# Patient Record
Sex: Female | Born: 1985 | Race: White | Hispanic: Yes | Marital: Single | Smoking: Never smoker
Health system: Southern US, Community
[De-identification: ages and names within clinical notes are randomized; demographics above are authoritative.]

## PROBLEM LIST (undated history)

## (undated) DIAGNOSIS — O00109 Unspecified tubal pregnancy without intrauterine pregnancy: Secondary | ICD-10-CM

## (undated) HISTORY — PX: OOPHORECTOMY: SHX86

---

## 2015-05-24 DIAGNOSIS — R040 Epistaxis: Secondary | ICD-10-CM

## 2018-06-03 ENCOUNTER — Emergency Department (HOSPITAL_COMMUNITY): Payer: No Typology Code available for payment source

## 2018-06-03 ENCOUNTER — Emergency Department (HOSPITAL_COMMUNITY)
Admission: EM | Admit: 2018-06-03 | Discharge: 2018-06-03 | Disposition: A | Discharge status: Home / Self Care | Payer: No Typology Code available for payment source | Attending: Emergency Medicine | Admitting: Emergency Medicine

## 2018-06-03 ENCOUNTER — Encounter: Payer: Self-pay | Admitting: Emergency Medicine

## 2018-06-03 DIAGNOSIS — O9989 Other specified diseases and conditions complicating pregnancy, childbirth and the puerperium: Secondary | ICD-10-CM | POA: Diagnosis not present

## 2018-06-03 DIAGNOSIS — Z3A14 14 weeks gestation of pregnancy: Secondary | ICD-10-CM | POA: Diagnosis not present

## 2018-06-03 DIAGNOSIS — R1031 Right lower quadrant pain: Secondary | ICD-10-CM

## 2018-06-03 DIAGNOSIS — R102 Pelvic and perineal pain unspecified side: Secondary | ICD-10-CM

## 2018-06-03 HISTORY — DX: Unspecified tubal pregnancy without intrauterine pregnancy: O00.109

## 2018-06-03 LAB — CBC WITH DIFFERENTIAL/PLATELET
Abs Immature Granulocytes: 0.04 10*3/uL (ref 0.00–0.07)
Basophils Absolute: 0 10*3/uL (ref 0.0–0.1)
Basophils Relative: 0 %
Eosinophils Absolute: 0.3 10*3/uL (ref 0.0–0.5)
Eosinophils Relative: 3 %
HCT: 39.5 % (ref 36.0–46.0)
Hemoglobin: 12.8 g/dL (ref 12.0–15.0)
Immature Granulocytes: 0 %
Lymphocytes Relative: 14 %
Lymphs Abs: 1.5 10*3/uL (ref 0.7–4.0)
MCH: 29.9 pg (ref 26.0–34.0)
MCHC: 32.4 g/dL (ref 30.0–36.0)
MCV: 92.3 fL (ref 80.0–100.0)
Monocytes Absolute: 0.6 10*3/uL (ref 0.1–1.0)
Monocytes Relative: 5 %
Neutro Abs: 8.3 10*3/uL — ABNORMAL HIGH (ref 1.7–7.7)
Neutrophils Relative %: 78 %
Platelets: 228 10*3/uL (ref 150–400)
RBC: 4.28 MIL/uL (ref 3.87–5.11)
RDW: 13.6 % (ref 11.5–15.5)
WBC: 10.6 10*3/uL — ABNORMAL HIGH (ref 4.0–10.5)
nRBC: 0 % (ref 0.0–0.2)

## 2018-06-03 LAB — BASIC METABOLIC PANEL
Anion gap: 8 (ref 5–15)
BUN: 6 mg/dL (ref 6–20)
CO2: 22 mmol/L (ref 22–32)
CREATININE: 0.5 mg/dL (ref 0.44–1.00)
Calcium: 8.7 mg/dL — ABNORMAL LOW (ref 8.9–10.3)
Chloride: 107 mmol/L (ref 98–111)
GFR calc non Af Amer: >60 mL/min (ref >60)
Glucose, Bld: 87 mg/dL (ref 70–99)
Potassium: 4.1 mmol/L (ref 3.5–5.1)
Sodium: 137 mmol/L (ref 135–145)

## 2018-06-03 LAB — POC URINE PREG, ED: Preg Test, Ur: POSITIVE — AB

## 2018-06-03 LAB — ABO/RH: ABO/RH(D): O POS

## 2018-06-03 MED ORDER — ACETAMINOPHEN 325 MG PO TABS
650.0000 mg | ORAL_TABLET | Freq: Once | ORAL | Status: AC
  Administered 2018-06-03: 650 mg via ORAL
  Filled 2018-06-03: qty 2

## 2018-06-03 MED ORDER — SODIUM CHLORIDE 0.9 % IV SOLN
INTRAVENOUS | Status: DC
  Administered 2018-06-03: 15 mL/h via INTRAVENOUS

## 2018-06-03 NOTE — ED Notes (Signed)
Pt tearful, introduced myself, VSS, pt to U/S via stretcher

## 2018-06-03 NOTE — ED Notes (Signed)
Pt to MRI via stretcher.

## 2018-06-03 NOTE — ED Notes (Signed)
Per interpretor pt does not want female visitor at bedside when any medical issues are discussed.  MD aware.

## 2018-06-03 NOTE — ED Provider Notes (Signed)
Pt signed out from Dr. Freida Busman.    EXAM: LIMITED OBSTETRIC ULTRASOUND  FINDINGS: Number of Fetuses: 1  Heart Rate:  155 bpm  Movement: Present  Presentation: Breech  Placental Location: Anterior  Previa: None  Amniotic Fluid (Subjective):  Normal  BPD: 1.43 cm 14 w  2 d  MATERNAL FINDINGS:  Cervix:  Appears closed.  Uterus/Adnexae: No abnormality visualized.  IMPRESSION: 1. Single living intrauterine fetus in breech presentation. 2. Limited biometry is estimated gestational age of [redacted] weeks 2 days. 3. This exam is performed on an emergent basis and does not comprehensively evaluate fetal size, dating, or anatomy; follow-up complete OB US should be considered if further fetal assessment is warranted.  Pt still has RLQ pain, so MRI abd ordered.  MRI:  IMPRESSION: 1. Normal unenhanced CT of the abdomen. 2. Gravid uterus. See report from limited obstetric ultrasound performed earlier today.  All questions answered via interpreter.  Pt is stable for d/c.  Return if worse.  Due to language barrier, an interpreter was present during the history-taking and subsequent discussion (and for part of the physical exam) with this patient.    Jacalyn Lefevre, MD 06/03/18 2014
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
## 2022-06-23 ENCOUNTER — Encounter (HOSPITAL_BASED_OUTPATIENT_CLINIC_OR_DEPARTMENT_OTHER): Payer: Self-pay | Admitting: Urology

## 2022-06-23 ENCOUNTER — Emergency Department (HOSPITAL_BASED_OUTPATIENT_CLINIC_OR_DEPARTMENT_OTHER)
Admission: EM | Admit: 2022-06-23 | Discharge: 2022-06-23 | Disposition: A | Discharge status: Home / Self Care | Payer: Self-pay | Attending: Emergency Medicine | Admitting: Emergency Medicine

## 2022-06-23 ENCOUNTER — Other Ambulatory Visit: Payer: Self-pay

## 2022-06-23 ENCOUNTER — Emergency Department (HOSPITAL_BASED_OUTPATIENT_CLINIC_OR_DEPARTMENT_OTHER): Payer: Self-pay

## 2022-06-23 DIAGNOSIS — R0789 Other chest pain: Secondary | ICD-10-CM | POA: Insufficient documentation

## 2022-06-23 MED ORDER — OXYCODONE HCL 5 MG PO TABS: 5.0000 mg | ORAL_TABLET | Freq: Four times a day (QID) | ORAL | 0 refills | Status: AC | PRN

## 2022-06-23 MED ORDER — ACETAMINOPHEN 500 MG PO TABS
1000.0000 mg | ORAL_TABLET | Freq: Once | ORAL | Status: AC
  Administered 2022-06-23: 1000 mg via ORAL
  Filled 2022-06-23: qty 2

## 2022-06-23 MED ORDER — OXYCODONE HCL 5 MG PO TABS
5.0000 mg | ORAL_TABLET | Freq: Once | ORAL | Status: AC
  Administered 2022-06-23: 5 mg via ORAL
  Filled 2022-06-23: qty 1

## 2022-06-23 MED ORDER — KETOROLAC TROMETHAMINE 15 MG/ML IJ SOLN
15.0000 mg | Freq: Once | INTRAMUSCULAR | Status: AC
  Administered 2022-06-23: 15 mg via INTRAMUSCULAR
  Filled 2022-06-23: qty 1
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
## 2022-07-03 DIAGNOSIS — T7491XA Unspecified adult maltreatment, confirmed, initial encounter: Secondary | ICD-10-CM | POA: Insufficient documentation
